# Patient Record
Sex: Male | Born: 1935 | Race: White | Hispanic: No | Marital: Married
Health system: Southern US, Community
[De-identification: ages and names within clinical notes are randomized; demographics above are authoritative.]

---

## 2005-01-16 ENCOUNTER — Ambulatory Visit: Payer: Self-pay | Admitting: Family Medicine

## 2005-03-09 ENCOUNTER — Emergency Department: Payer: Self-pay | Admitting: Emergency Medicine

## 2005-04-17 ENCOUNTER — Ambulatory Visit: Payer: Self-pay | Admitting: Cardiology

## 2006-10-24 ENCOUNTER — Ambulatory Visit: Payer: Self-pay | Admitting: Family Medicine

## 2008-05-12 ENCOUNTER — Ambulatory Visit: Payer: Self-pay | Admitting: General Surgery

## 2008-07-31 ENCOUNTER — Inpatient Hospital Stay: Payer: Self-pay | Admitting: Specialist

## 2008-08-09 ENCOUNTER — Ambulatory Visit (HOSPITAL_COMMUNITY): Admission: RE | Admit: 2008-08-09 | Discharge: 2008-08-09 | Payer: Self-pay | Admitting: Gastroenterology

## 2008-08-16 ENCOUNTER — Ambulatory Visit: Payer: Self-pay | Admitting: General Surgery

## 2008-08-17 ENCOUNTER — Inpatient Hospital Stay: Payer: Self-pay | Admitting: Surgery

## 2008-08-28 ENCOUNTER — Emergency Department: Payer: Self-pay | Admitting: Emergency Medicine

## 2008-08-30 ENCOUNTER — Ambulatory Visit: Payer: Self-pay | Admitting: General Surgery

## 2008-09-05 ENCOUNTER — Other Ambulatory Visit: Payer: Self-pay | Admitting: General Surgery

## 2010-05-13 LAB — DIFFERENTIAL
Eosinophils Absolute: 0.1 10*3/uL (ref 0.0–0.7)
Eosinophils Relative: 1 % (ref 0–5)
Lymphs Abs: 1.4 10*3/uL (ref 0.7–4.0)
Monocytes Absolute: 0.8 10*3/uL (ref 0.1–1.0)
Monocytes Relative: 9 % (ref 3–12)

## 2010-05-13 LAB — COMPREHENSIVE METABOLIC PANEL
ALT: 57 U/L — ABNORMAL HIGH (ref 0–53)
AST: 37 U/L (ref 0–37)
Albumin: 1.9 g/dL — ABNORMAL LOW (ref 3.5–5.2)
Alkaline Phosphatase: 553 U/L — ABNORMAL HIGH (ref 39–117)
BUN: 13 mg/dL (ref 6–23)
CO2: 30 mEq/L (ref 19–32)
Calcium: 8.4 mg/dL (ref 8.4–10.5)
Chloride: 99 mEq/L (ref 96–112)
Creatinine, Ser: 0.98 mg/dL (ref 0.4–1.5)
GFR calc Af Amer: >60 mL/min (ref >60)
GFR calc non Af Amer: >60 mL/min (ref >60)
Glucose, Bld: 120 mg/dL — ABNORMAL HIGH (ref 70–99)
Potassium: 3.1 mEq/L — ABNORMAL LOW (ref 3.5–5.1)
Sodium: 137 mEq/L (ref 135–145)
Total Bilirubin: 2.4 mg/dL — ABNORMAL HIGH (ref 0.3–1.2)
Total Protein: 5.2 g/dL — ABNORMAL LOW (ref 6.0–8.3)

## 2010-05-13 LAB — CBC
MCHC: 35 g/dL (ref 30.0–36.0)
RBC: 2.86 MIL/uL — ABNORMAL LOW (ref 4.22–5.81)
RDW: 14.6 % (ref 11.5–15.5)

## 2010-05-13 LAB — PROTIME-INR
INR: 1.2 (ref 0.00–1.49)
Prothrombin Time: 15.9 seconds — ABNORMAL HIGH (ref 11.6–15.2)

## 2010-05-13 LAB — TYPE AND SCREEN: Antibody Screen: NEGATIVE

## 2010-06-19 NOTE — Op Note (Signed)
NAMESINCERE, LIUZZI              ACCOUNT NO.:  [id number]   MEDICAL RECORD NO.:  [id number]          PATIENT TYPE:  AMB   LOCATION:  ENDO                         FACILITY:  [facility]   PHYSICIAN:  Petra Kuba, M.D.    DATE OF BIRTH:  [birth year]   DATE OF PROCEDURE:  08/09/2008  DATE OF DISCHARGE:                               OPERATIVE REPORT   PROCEDURE:  Endoscopic retrograde cholangiopancreatography.   SURGEON:  Petra Kuba, MD   INDICATIONS:  A patient with failed ERCP in [place], not able to find  the ampulla, requesting a second attempt.  Consent was signed after  risks, benefits, methods, and options were thoroughly discussed with  both the patient and his family.   MEDICINES USED THROUGHOUT THE PROCEDURE:  Fentanyl 100 mcg, Versed 16  mg, and glucagon 0.5 mg.   PROCEDURE IN DETAIL:  The side-viewing therapeutic video duodenoscope  was inserted by direct vision into the stomach and advanced through a  normal antrum and pylorus into the duodenum.  Multiple diverticula were  seen but the ampulla was seen to be found along the side of the an  ampulla.  Unfortunately, the scope had a problem with the channel and we  could not advance the sphincterotome through the channel and we had to  withdraw the scope and reinserted a second scope which was done in the  customary fashion without difficulty.  The ampulla was brought into  view.  Unfortunately, we could not advance the wire into the CBD.  We  did get a few pancreatic injections.  There was some spasm which  required glucagon.  We did switch to the smaller tapered sphincterotome  with the smaller wire but met the same problems.  We did go ahead and  rolled the patient onto his left side which again enabled [place] to get  adequate positioning on the papilla but again despite trying both  sphincterotomes as above and even rotating the sphincterotomes to try to  change the angle, we were unsuccessful at obtaining deep  selective  cannulation.  There were a few injections that we thought were heading  towards the CBD and later when we rolled him one more time back on his  belly, either a dilated CBD with one small stone or a gallstone in the  gallbladder was seen which confirmed that we had at least injected some  dye into the CBD.  Unfortunately, we did get a few submucosal  injections.  We did even try a straight catheter to see if that would be  of any assistance and again met the same fate.  The wires never were  passed deep into the pancreas but we did get a few PD injections.  We  did rolled him back on his back as mentioned above and did try with the  straight catheter and a new sphincterotome but at that point there was a  little too much edema and ampullary trauma and again despite a prolonged  effort, we were unable to get deep selective cannulation and we elected  to stop the procedure at this junction.  The scope was removed.  The  patient tolerated the procedure well.  There was no obvious immediate  complication.   ENDOSCOPIC DIAGNOSES:  1. Periampullary diverticula and a few other duodenal diverticula.  2. Few pancreatic duct injections, not overfilled.  3. Either common bile duct or gallbladder with small stone seen on      pictures at the end of the procedure when we rolled him back on his      belly.  4. Unable to get deep selective cannulation.  5. Tried two sphincterotomes and a straight catheter and him in two      different positions.  6. Submucosal injection.   PLAN:  Observe for delayed complications.  If none, the family will  think about surgical options versus a university trial to include  possible needle knife sphincterotomy and we would be happy to set that  up at either [facility] or [facility] based on family preference and we will  discuss with them after the procedure.           ______________________________  Petra Kuba, M.D.     MEM/MEDQ  D:  08/09/2008  T:   08/10/2008  Job:  [id number]   cc:   Alvira Philips, M.D.  Dr. Bluford Kaufmann

## 2010-09-26 ENCOUNTER — Emergency Department: Payer: Self-pay | Admitting: Emergency Medicine

## 2010-10-04 ENCOUNTER — Inpatient Hospital Stay: Payer: Self-pay | Admitting: Internal Medicine

## 2010-10-06 ENCOUNTER — Ambulatory Visit: Payer: Self-pay | Admitting: Internal Medicine

## 2010-10-22 ENCOUNTER — Inpatient Hospital Stay: Payer: Self-pay | Admitting: Internal Medicine

## 2010-11-05 ENCOUNTER — Ambulatory Visit: Payer: Self-pay | Admitting: Internal Medicine

## 2010-11-12 ENCOUNTER — Ambulatory Visit: Payer: Self-pay | Admitting: Urology

## 2010-11-23 ENCOUNTER — Inpatient Hospital Stay: Payer: Self-pay | Admitting: Internal Medicine

## 2010-11-30 ENCOUNTER — Ambulatory Visit: Payer: Self-pay | Admitting: Urology

## 2010-12-06 ENCOUNTER — Ambulatory Visit: Payer: Self-pay | Admitting: Internal Medicine

## 2010-12-12 ENCOUNTER — Ambulatory Visit: Payer: Self-pay | Admitting: Urology

## 2010-12-15 ENCOUNTER — Emergency Department: Payer: Self-pay | Admitting: Emergency Medicine

## 2010-12-23 ENCOUNTER — Emergency Department: Payer: Self-pay | Admitting: Unknown Physician Specialty

## 2011-01-24 ENCOUNTER — Emergency Department: Payer: Self-pay | Admitting: Internal Medicine

## 2011-02-03 ENCOUNTER — Emergency Department: Payer: Self-pay | Admitting: *Deleted

## 2011-02-05 ENCOUNTER — Ambulatory Visit: Payer: Self-pay | Admitting: Internal Medicine

## 2011-02-05 ENCOUNTER — Ambulatory Visit: Payer: Self-pay | Admitting: Oncology

## 2011-03-02 LAB — CBC WITH DIFFERENTIAL/PLATELET
Basophil %: 0.3 %
Eosinophil #: 0.3 10*3/uL (ref 0.0–0.7)
Eosinophil %: 2.6 %
Lymphocyte #: 1.8 10*3/uL (ref 1.0–3.6)
MCH: 29.1 pg (ref 26.0–34.0)
MCV: 91 fL (ref 80–100)
Monocyte #: 0.8 10*3/uL — ABNORMAL HIGH (ref 0.0–0.7)
Neutrophil #: 8.4 10*3/uL — ABNORMAL HIGH (ref 1.4–6.5)
Platelet: 216 10*3/uL (ref 150–440)
RDW: 15.8 % — ABNORMAL HIGH (ref 11.5–14.5)

## 2011-03-02 LAB — BASIC METABOLIC PANEL
Calcium, Total: 8.9 mg/dL (ref 8.5–10.1)
Chloride: 104 mmol/L (ref 98–107)
Co2: 28 mmol/L (ref 21–32)
EGFR (African American): >60
Sodium: 142 mmol/L (ref 136–145)

## 2011-03-03 ENCOUNTER — Inpatient Hospital Stay: Payer: Self-pay | Admitting: Internal Medicine

## 2011-03-03 LAB — CK TOTAL AND CKMB (NOT AT ARMC)
CK, Total: 61 U/L (ref 35–232)
CK-MB: 1.2 ng/mL (ref 0.5–3.6)
CK-MB: 1.2 ng/mL (ref 0.5–3.6)

## 2011-03-03 LAB — TROPONIN I: Troponin-I: <0.02 ng/mL

## 2011-03-04 LAB — BASIC METABOLIC PANEL
Anion Gap: 9 (ref 7–16)
Chloride: 109 mmol/L — ABNORMAL HIGH (ref 98–107)
Co2: 27 mmol/L (ref 21–32)
Creatinine: 1.06 mg/dL (ref 0.60–1.30)
Glucose: 103 mg/dL — ABNORMAL HIGH (ref 65–99)
Potassium: 4.1 mmol/L (ref 3.5–5.1)

## 2011-03-04 LAB — CBC WITH DIFFERENTIAL/PLATELET
Basophil #: 0 10*3/uL (ref 0.0–0.1)
Eosinophil #: 0.3 10*3/uL (ref 0.0–0.7)
HCT: 32.3 % — ABNORMAL LOW (ref 40.0–52.0)
Lymphocyte %: 28.6 %
MCHC: 32.6 g/dL (ref 32.0–36.0)
Monocyte %: 9 %
Neutrophil #: 3.9 10*3/uL (ref 1.4–6.5)
Neutrophil %: 57.1 %
Platelet: 155 10*3/uL (ref 150–440)
RBC: 3.62 10*6/uL — ABNORMAL LOW (ref 4.40–5.90)
RDW: 15.3 % — ABNORMAL HIGH (ref 11.5–14.5)

## 2011-03-04 LAB — LIPID PANEL
HDL Cholesterol: 34 mg/dL — ABNORMAL LOW (ref 40–60)
VLDL Cholesterol, Calc: 23 mg/dL (ref 5–40)

## 2011-03-06 LAB — PSA: PSA: 2.9 ng/mL (ref 0.0–4.0)

## 2011-03-06 LAB — PROT IMMUNOELECTROPHORES(ARMC)

## 2011-03-08 ENCOUNTER — Ambulatory Visit: Payer: Self-pay | Admitting: Internal Medicine

## 2011-03-08 ENCOUNTER — Ambulatory Visit: Payer: Self-pay | Admitting: Oncology

## 2011-04-05 DEATH — deceased

## 2013-07-04 IMAGING — CR DG CHEST 2V
1 series · 2 of 2 positions shown · non-contrast
Comparison: none

REASON FOR EXAM: Shortness of Breath
COMMENTS:

PROCEDURE:     DXR - DXR CHEST PA (OR AP) AND LATERAL  - October 04, 2010  [DATE]
RESULT:     The lung fields are clear. No pneumonia, pneumothorax or pleural
effusion is seen. Heart size is normal. Mediastinal and osseous structures
show no significant abnormalities.

[Series 1: view not recorded · 0.17mm/px · 2 of 2 slices shown]
[im 1/2]
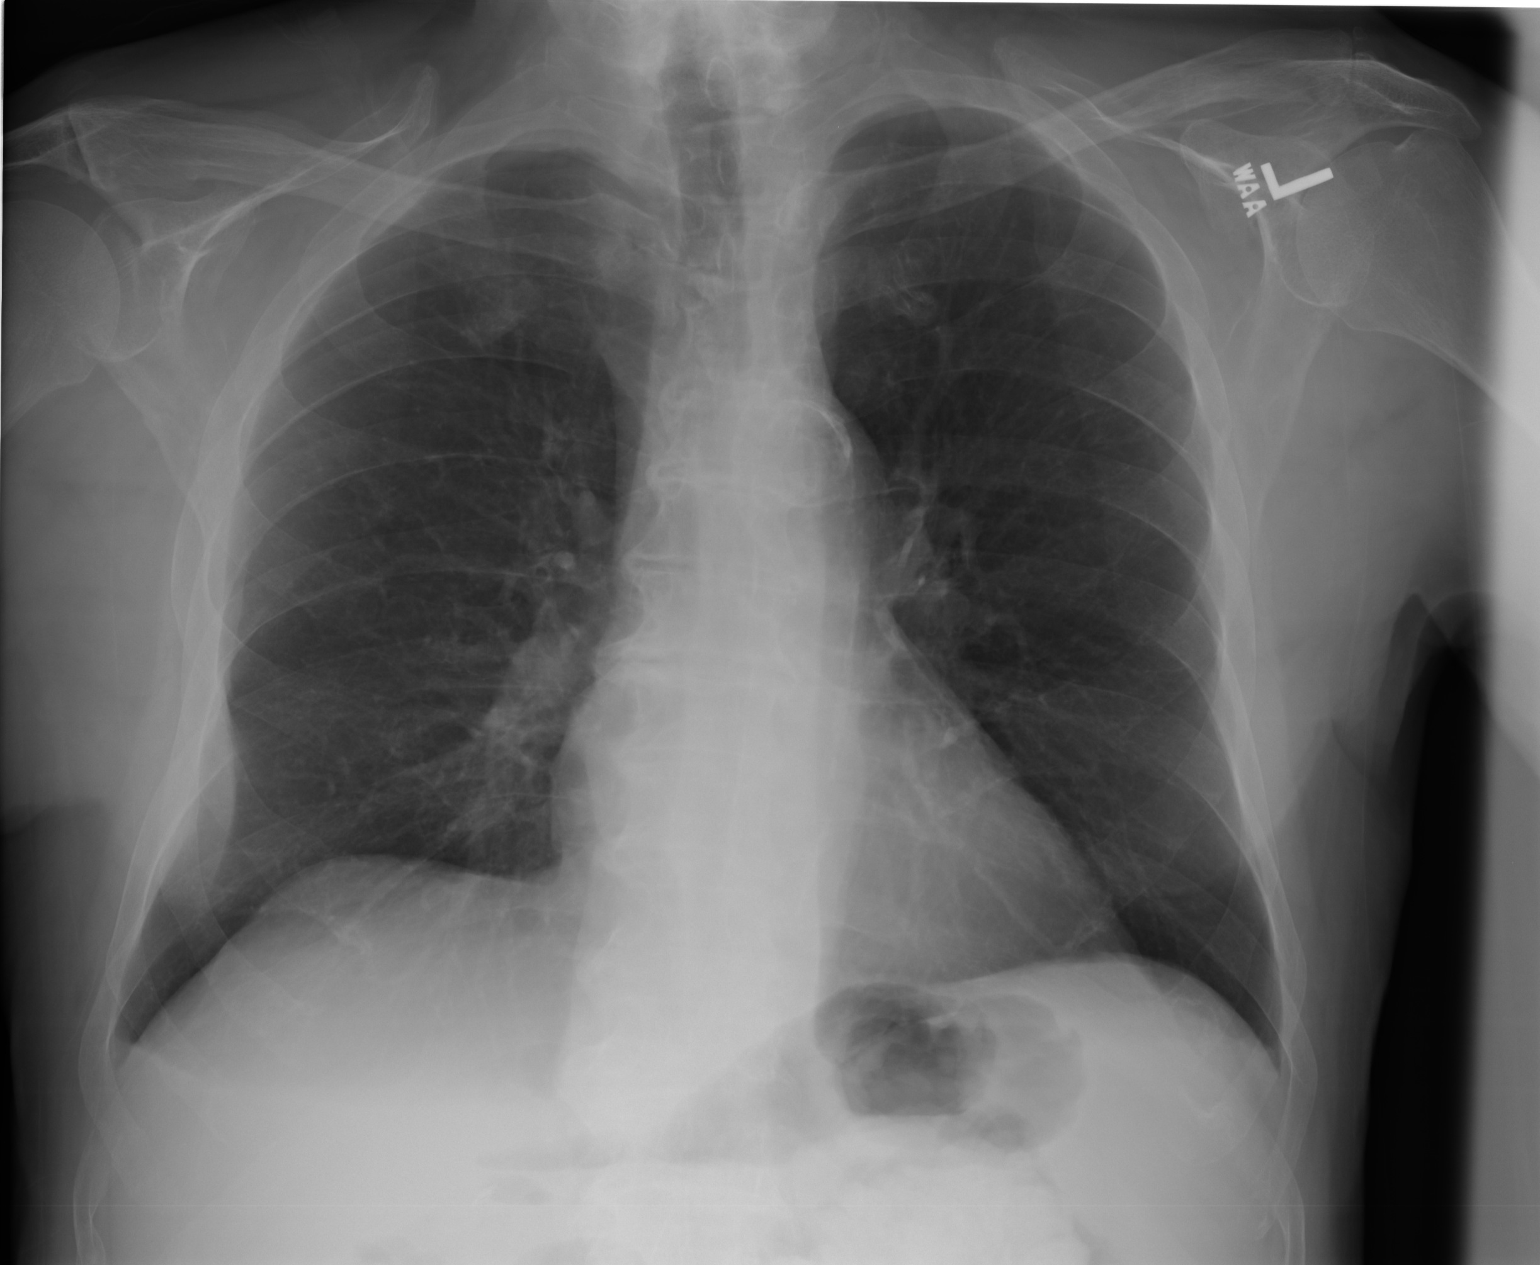
[im 2/2]
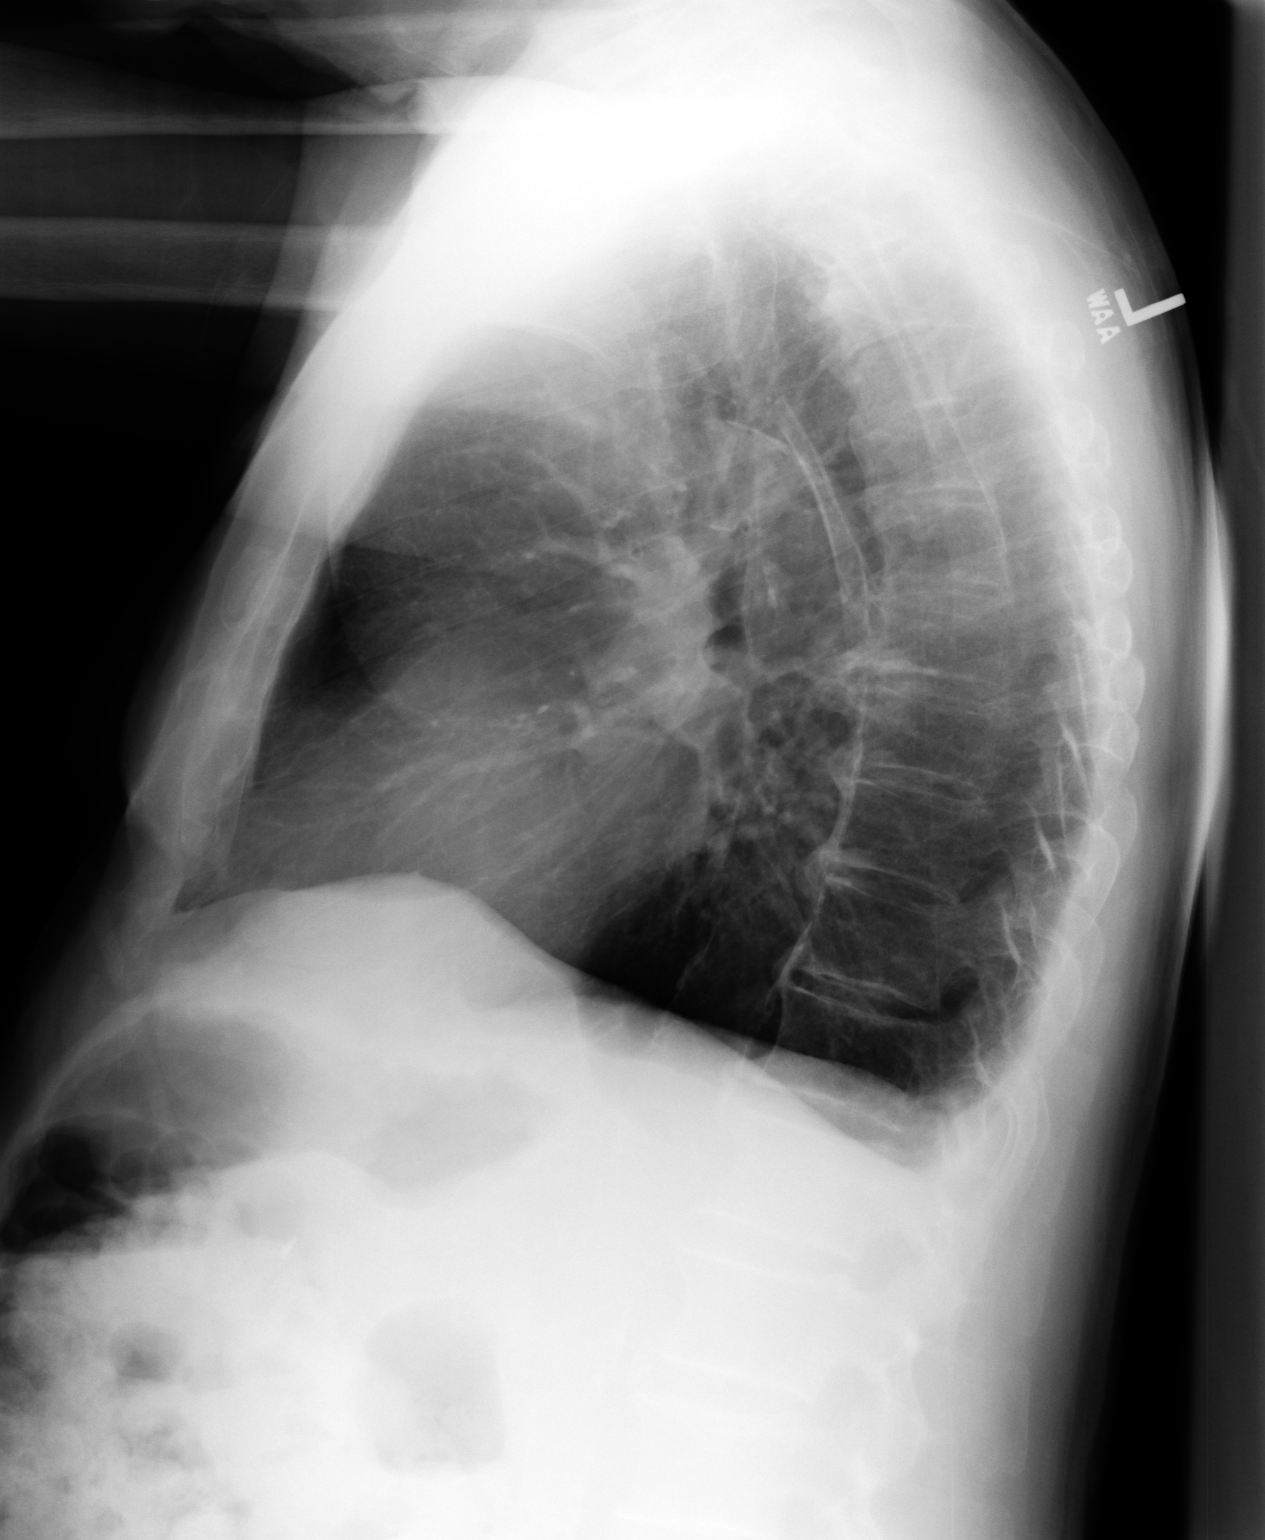

[2 of 2 positions shown; findings below may reference images not displayed]

IMPRESSION: No significant abnormalities are noted.

## 2013-07-12 IMAGING — CT CT ABD-PELV W/O CM
1 of 2 series · 14 of 32 positions shown, 18 images · non-contrast
Comparison: none

REASON FOR EXAM: (1) abdominal distention and pain; (2) pain;    NOTE:
Nursing to Give Oral CT Co
COMMENTS:

PROCEDURE:     CT  - CT ABDOMEN AND PELVIS W[DATE]  [DATE]
RESULT:     Comparison: 10/04/2010
TECHNIQUE: Multiple axial images from the lung bases to the symphysis pubis
were obtained with oral and without intravenous contrast.

[Series 2: 3mm soft tissue · axial · 0.76mm/px · z∈[-1109,-701]mm · 14 of 150 slices shown, 18 images]
[im 7/150  soft-tissue]
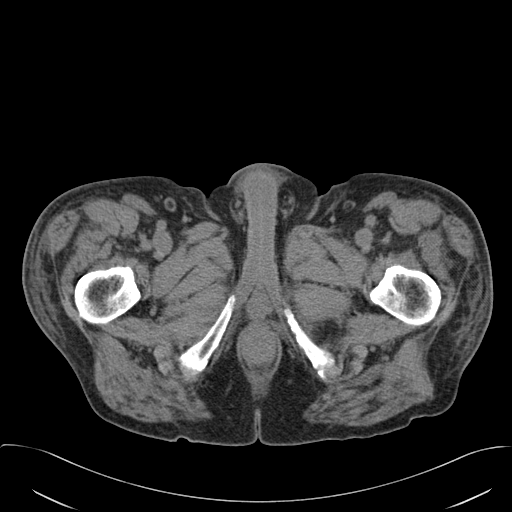
[im 7/150  bone]
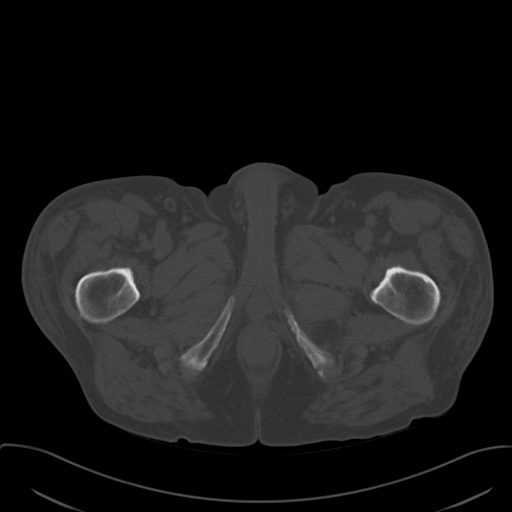
[im 19/150  soft-tissue]
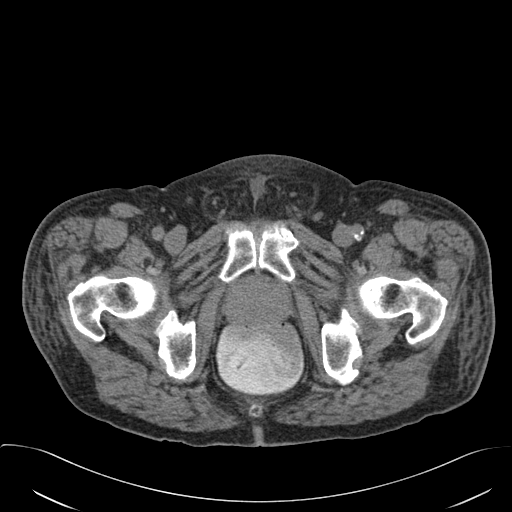
[im 32/150  soft-tissue]
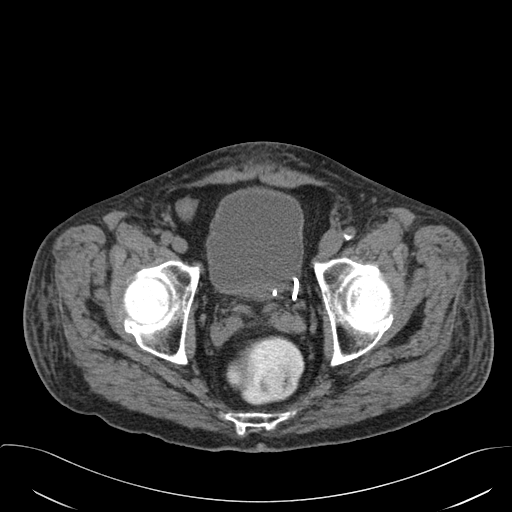
[im 44/150  soft-tissue]
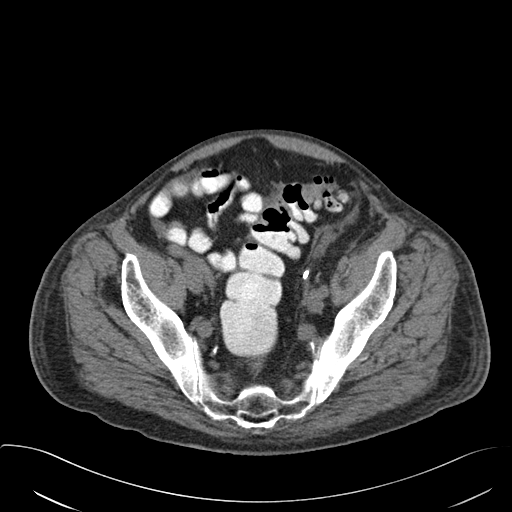
[im 56/150  soft-tissue]
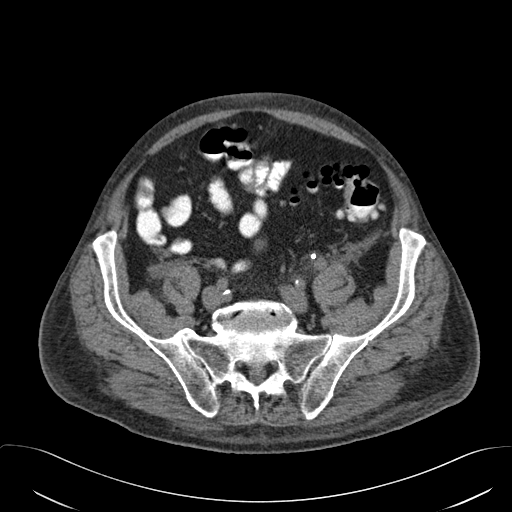
[im 69/150  soft-tissue]
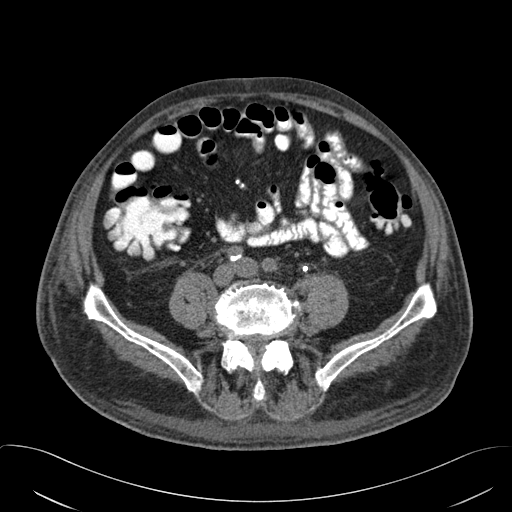
[im 81/150  soft-tissue]
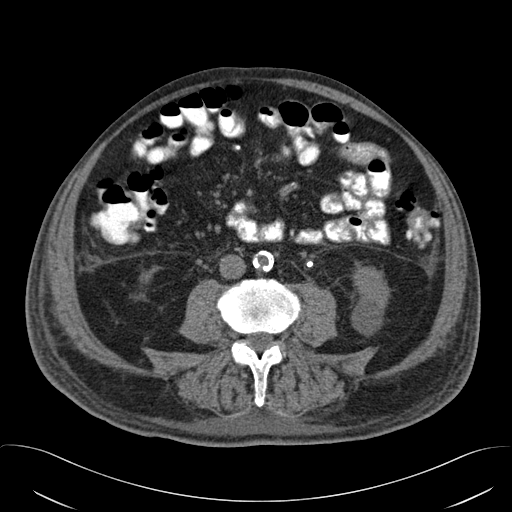
[im 94/150  soft-tissue]
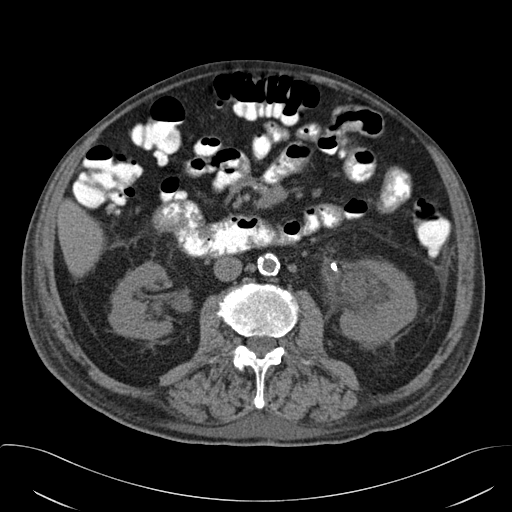
[im 106/150  soft-tissue]
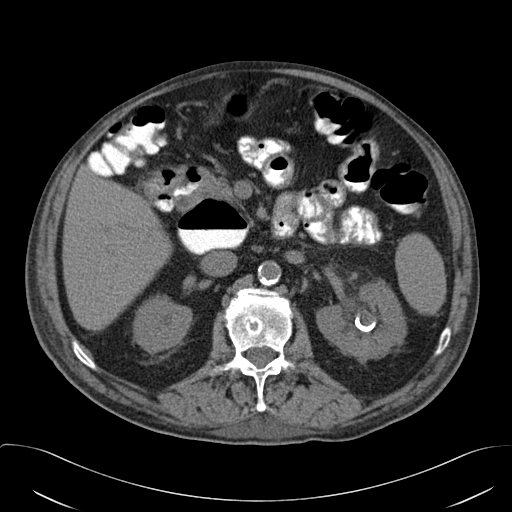
[im 106/150  bone]
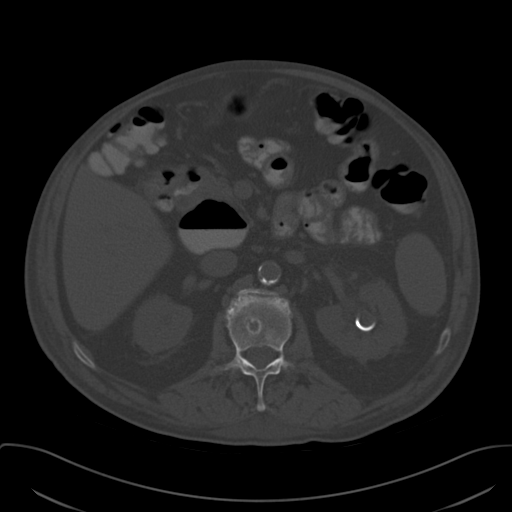
[im 118/150  soft-tissue]
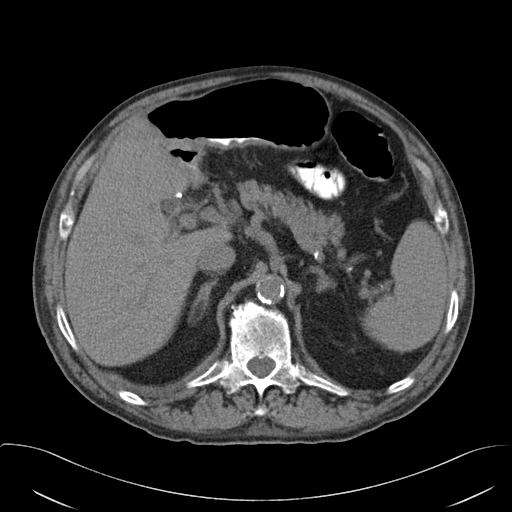
[im 125/150  lung]
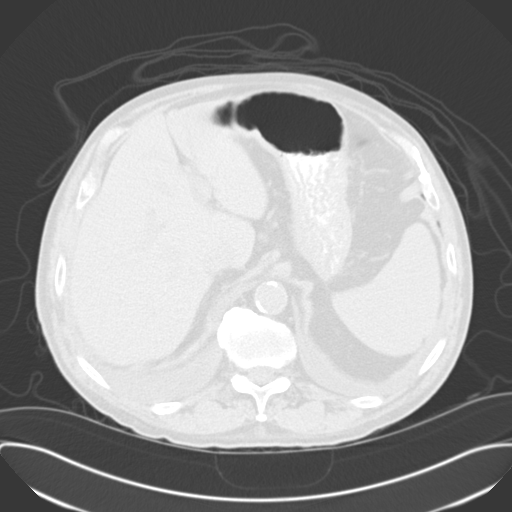
[im 131/150  soft-tissue]
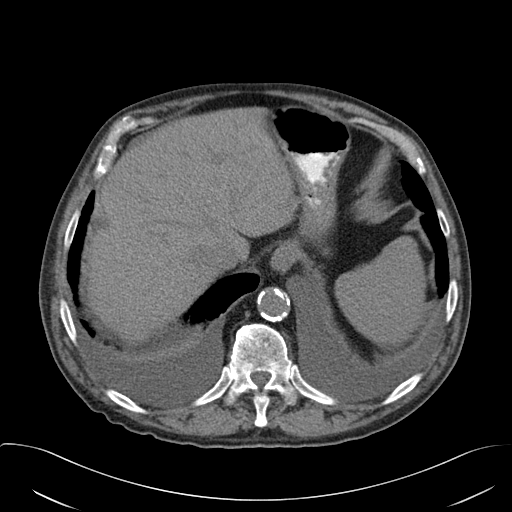
[im 131/150  lung]
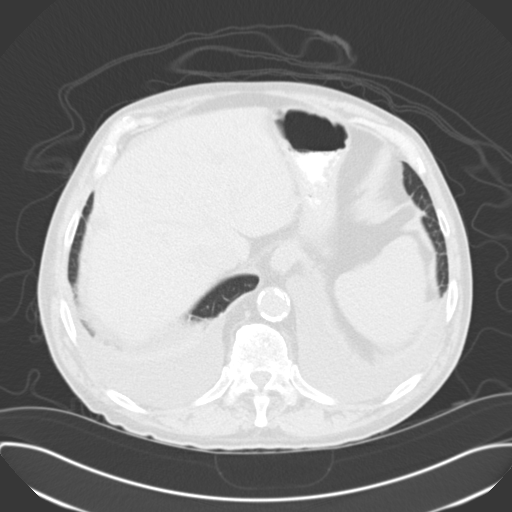
[im 137/150  lung]
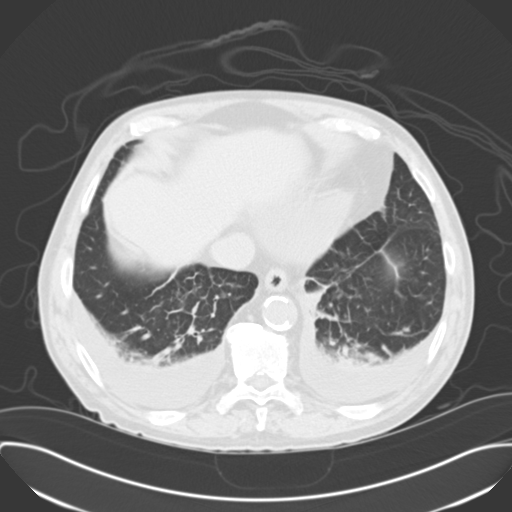
[im 143/150  soft-tissue]
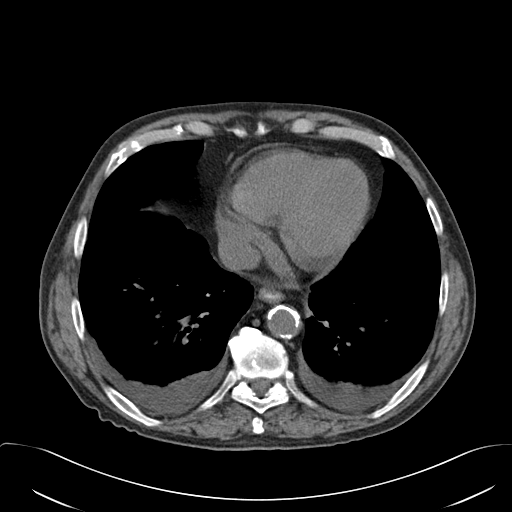
[im 143/150  lung]
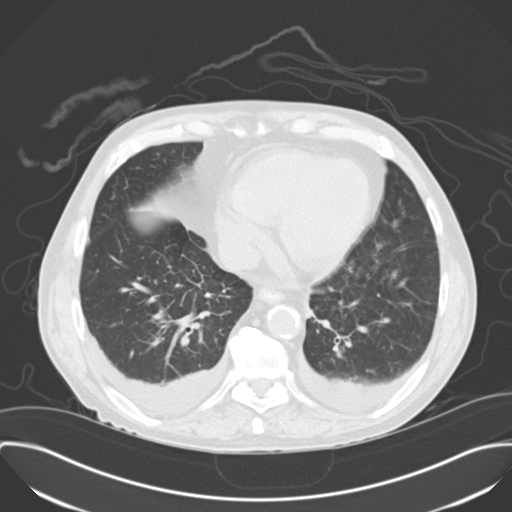

[14 of 32 positions shown; findings below may reference images not displayed]

FINDINGS: There are small bilateral pleural effusions. Dependent basilar opacities
likely represent atelectasis. There are multiple small groundglass nodules
in the lower lobes, right greater than left. These are new from the recent
prior CT.

Lack of intravenous contrast limits evaluation of the solid abdominal
organs.  Minimal low-attenuation along the falciform ligament likely
secondary to focal fatty infiltration. The patient is status post
cholecystectomy. The spleen, adrenals, and pancreas are unremarkable. There
is a large duodenal diverticulum as seen on prior studies.

There has been interval placement of a double J ureteral stent in the left
kidney which extends to the bladder. The distal catheter is coiled at or
just within the left ureterovesical junction. The calculus at the left UVJ
is no longer seen. There is mild left pelviectasis, which is decreased from
prior. Mild stranding of the left pelvis is also decreased. The calculus in
the proximal ureter is no longer visualized. Low-attenuation lesion in the
left kidney is consistent with a cyst. No hydronephrosis seen in the right
kidney.

The small and large bowel are normal in caliber. There is diverticulosis of
the sigmoid colon and descending colon. The appendix is normal. Mild
stranding and fluid extending along the left retroperitoneum is unchanged
from prior. The prostate is enlarged.

No aggressive lytic or sclerotic osseous lesions identified.
IMPRESSION: 1. Interval placement of left double-J ureteral stent. There is decreased
dilatation and stranding of the left renal collecting system. The distal
ureteral stent is coiled at or just within the left ureterovesical junction.
Consider slight repositioning.
2. Small bilateral pleural effusions. New mild basilar ground glass
opacities are nonspecific, but may represent aspiration or infection.
3. No evidence of bowel obstruction.

## 2013-10-24 IMAGING — US US EXTREM LOW VENOUS*L*
1 series · 17 of 24 positions shown · non-contrast
Comparison: none

REASON FOR EXAM: pain, swelling, trauma x 3 weeks ago
COMMENTS:   LMP: (Male)

[Series 1: us extrem low venous*left* · 17 of 30 slices shown]
[im 1/30]
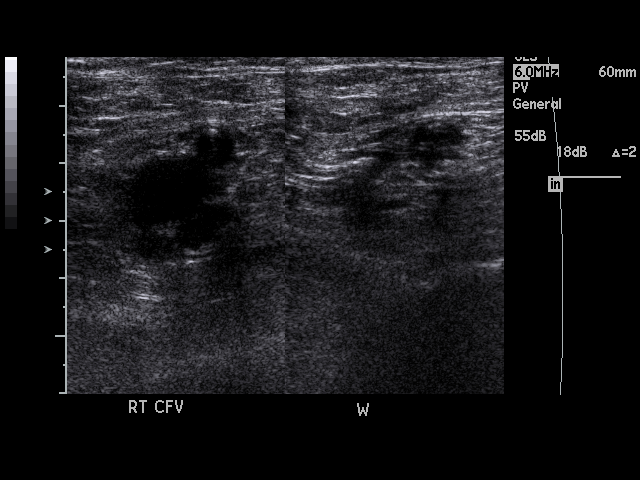
[im 3/30]
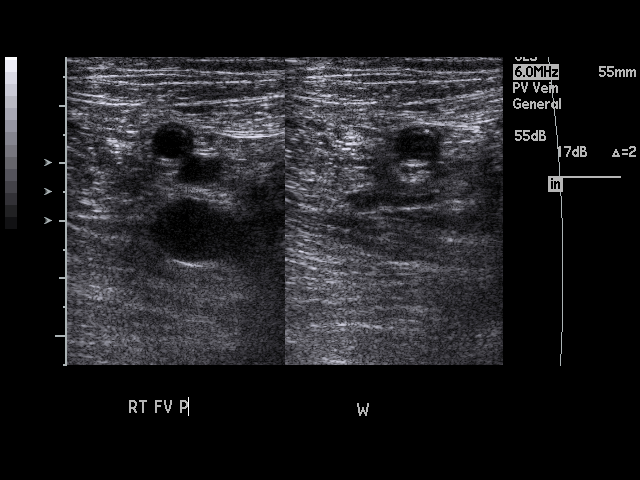
[im 4/30]
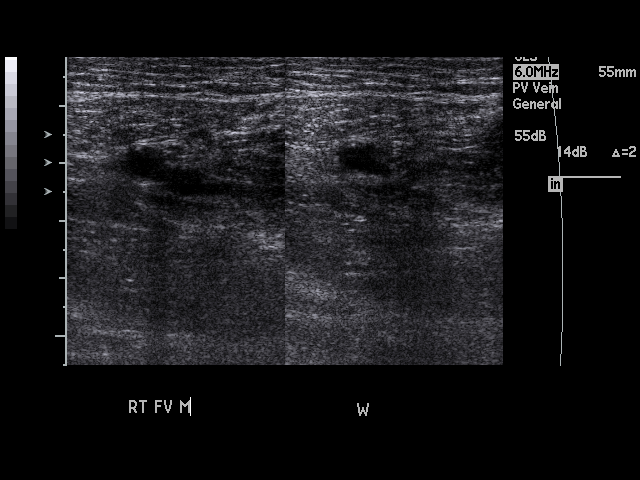
[im 6/30]
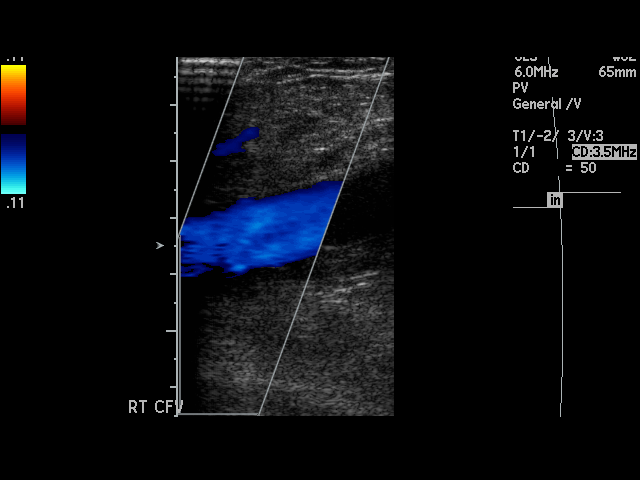
[im 8/30]
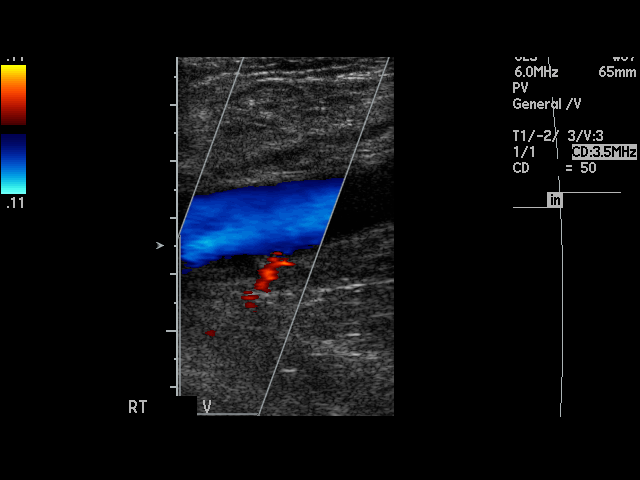
[im 9/30]
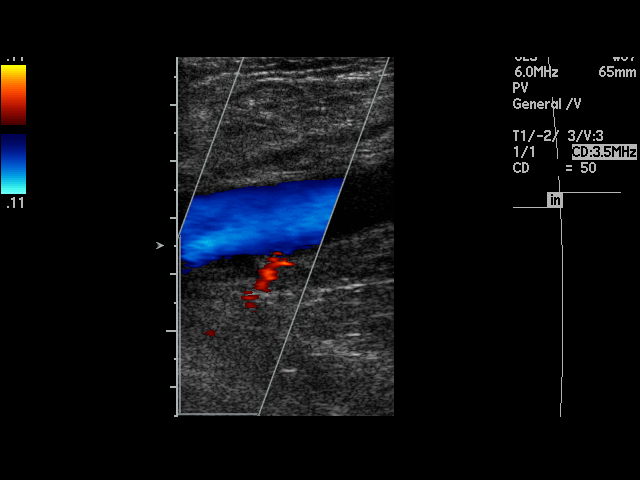
[im 12/30]
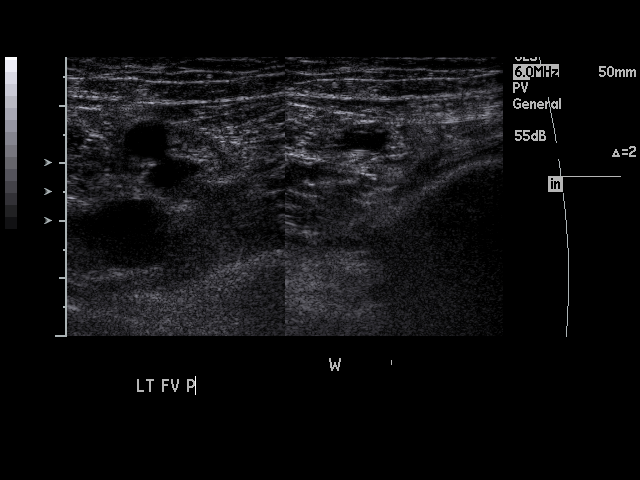
[im 13/30]
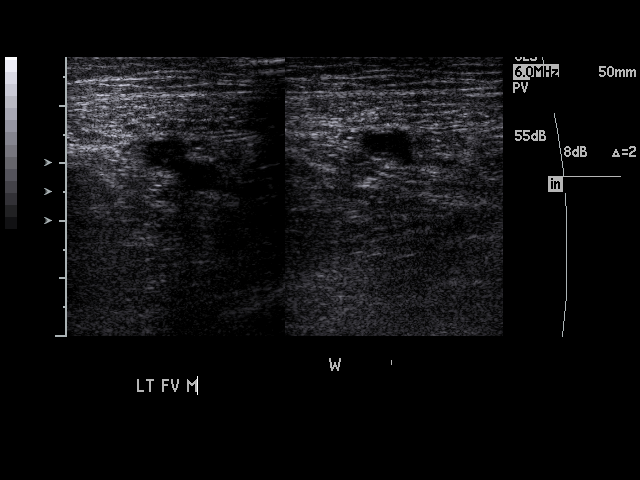
[im 16/30]
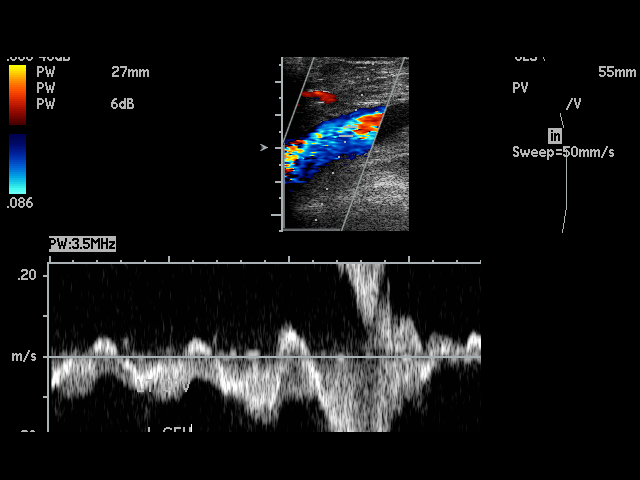
[im 17/30]
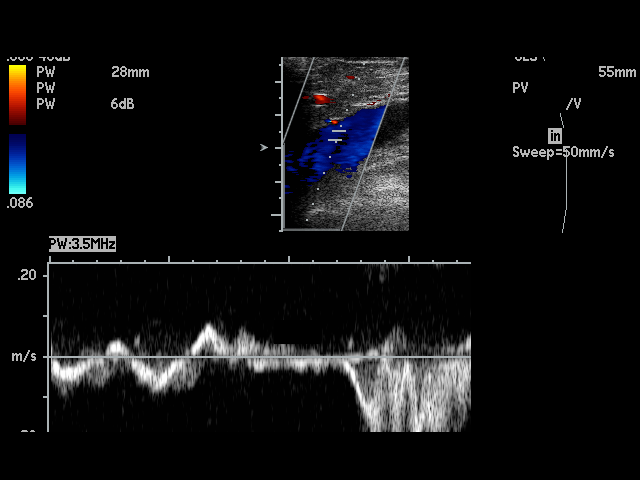
[im 18/30]
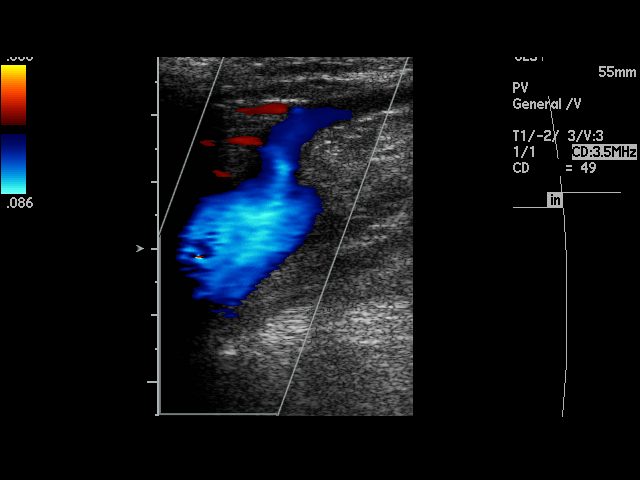
[im 21/30]
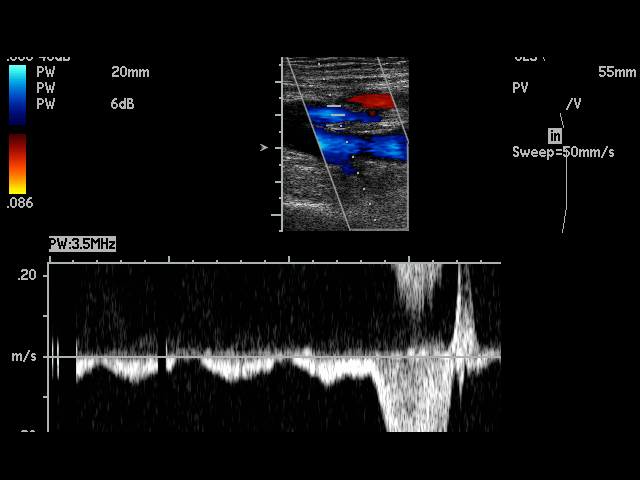
[im 22/30]
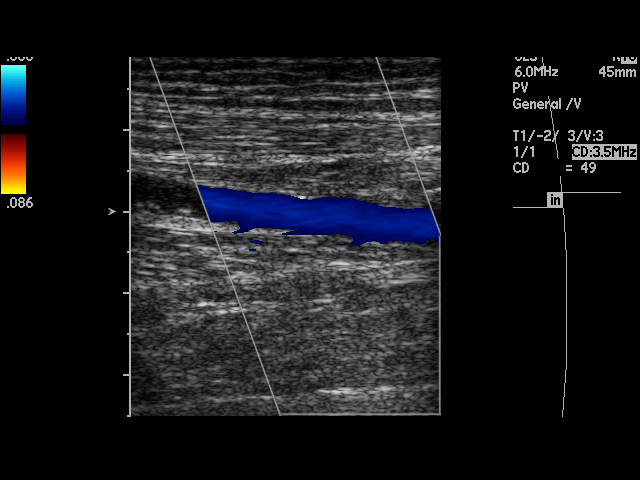
[im 24/30]
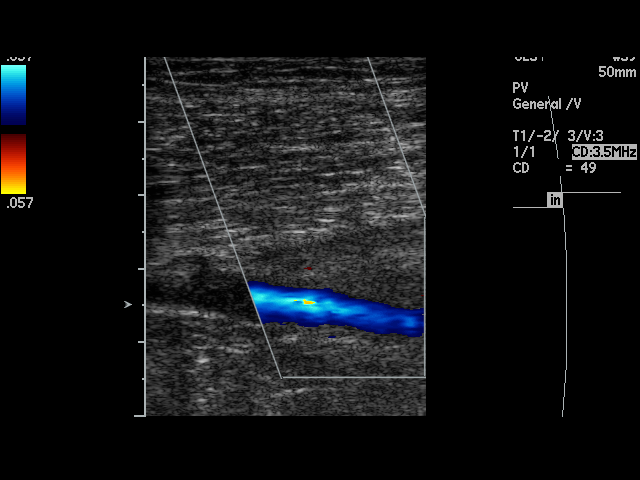
[im 26/30]
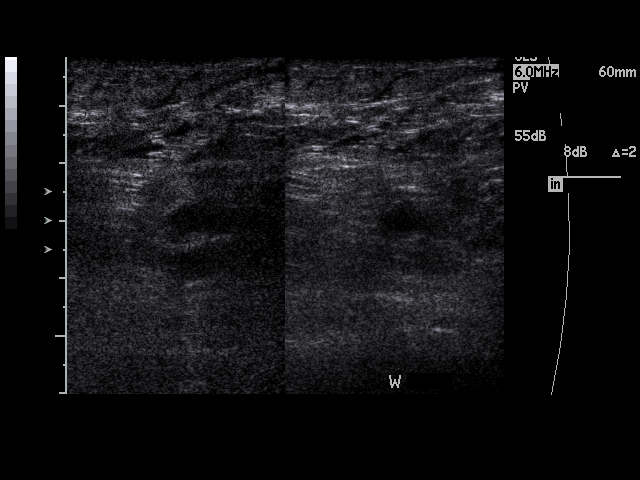
[im 27/30]
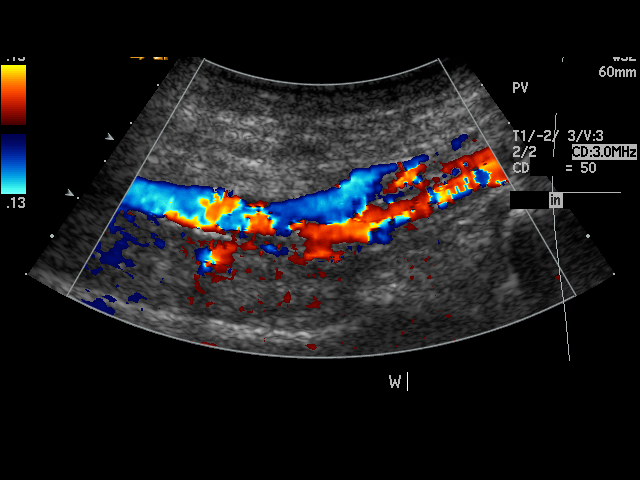
[im 30/30]
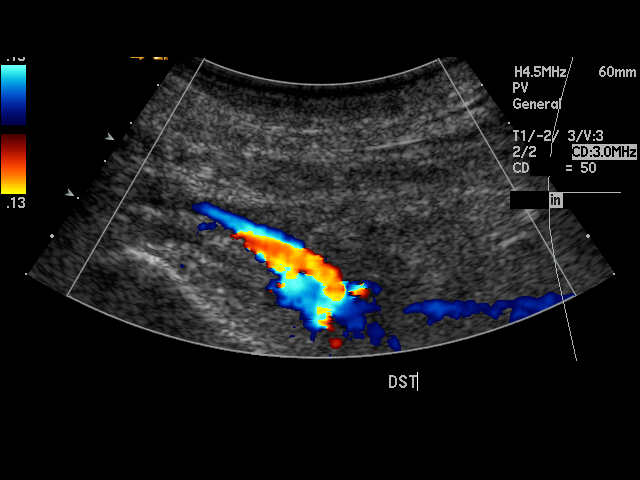

[17 of 24 positions shown; findings below may reference images not displayed]

PROCEDURE:     US  - US DOPPLER LOW EXTR LEFT  - January 24, 2011  [DATE]

RESULT:     Comparison: 12/15/2010

Technique and findings: Multiple longitudinal and transverse grayscale as
well as color and spectral Doppler images of the left lower extremity veins
were obtained from the common femoral veins through the popliteal veins.

Please note, the initial images are labeled right common femoral and femoral
vein. However, these images were obtained of the left leg. This was
confirmed with the ultrasound technologist. The left common femoral,
femoral, and popliteal veins are patent, demonstrating normal color-flow and
compressibility. No intraluminal thrombus is identified.  There is normal
respiratory variation and augmentation demonstrated at all vein levels.
IMPRESSION: No evidence of DVT in the left lower extremity.

## 2014-05-29 NOTE — H&P (Signed)
PATIENT NAME:  Dave Sanchez, Broly W MR#:  [id number] DATE OF BIRTH:  [birth year]  DATE OF ADMISSION:  03/03/2011  PRIMARY CARE PHYSICIAN: Richard L. Sullivan LoneGilbert, MD  CHIEF COMPLAINT: Fall.   HISTORY OF PRESENT ILLNESS: The patient is an 81 year old male with a history of sleep apnea, chronic obstructive pulmonary disease, diabetes, dementia, presents with chief complaint of fall. He complained of right-sided chest pain. He apparently lost his balance and fell from a standing position. He rubbed up against a shoe on the floor. He developed chest pain described as severe. He recently had a urinary tract infection one week ago. A chest x-ray in the Emergency Room showed multiple rib fractures. He underwent right CT of the cervical spine which showed multiple lytic lesions.   PAST MEDICAL HISTORY:  1. Obstructive uropathy.  2. Bile duct stone.  3. Sleep apnea.  4. Coronary artery disease.  5. Stent placement.  6. Diabetes type 2.  7. Hypercholesterolemia.  8. Hypertension.  9. Cholecystectomy. 10. Dementia.  11. Benign prostatic hypertrophy.  12. Depression.  ALLERGIES: No known drug allergies.   CURRENT MEDICATIONS:  1. Amlodipine 5 mg p.o. daily.  2. Aspirin 325 mg p.o. daily.  3. Flomax 0.4 mg p.o. in the a.m.  4. Haldol 0.4 mg p.r.n.  5. Keflex 500 mg p.o. b.i.d.  6. Lasix 20 mg p.o. daily.  7. Propranolol 40 mg p.o. t.i.d.  8. Risperdal 0.5 mg p.o. b.i.d.  9. Simvastatin 20 mg p.o. at bedtime.  10. Tylenol 500 mg p.o. every 6 hours.   SOCIAL HISTORY: The patient is a resident of a skilled nursing facility. No history of tobacco abuse, alcohol abuse, or drug abuse.   FAMILY HISTORY: Unable to obtain. The patient is not able to speak. He has history of severe dementia.   REVIEW OF SYSTEMS: HEENT: No visual problems, dysphagia, sore throat. CARDIOVASCULAR: No orthopnea, PND, syncope. RESPIRATORY: No cough, wheezing, or hemoptysis. GASTROINTESTINAL: No nausea, vomiting, abdominal pain,  hematemesis, hematochezia, or melena. GU: No hematuria, dysuria or frequency. NEUROLOGIC: No reports of headache, focal weakness, seizures. SKIN: No warts or lesions or  rash.   PHYSICAL EXAMINATION:  VITAL SIGNS: Temperature is 98.2, heart rate is 70, respiratory rate is 20, blood pressure 159/67, O2 saturations 100%.   HEENT: Head is atraumatic, normocephalic. Pupils are equal, round and reactive to light and accommodation. Extraocular movements are intact. Sclerae are anicteric. Mucous membranes are moist.   NECK: Supple. No organomegaly.   CARDIOVASCULAR: S1, S2, regular rate and rhythm. No gallops. No thrills. No murmurs.   LUNGS: Clear to auscultation. No rales, no rhonchi, no wheezes, no bronchial breath sounds.   ABDOMEN: Abdomen is soft, nontender nondistended. Normal bowel sounds. No hepatosplenomegaly.   GENITOURINARY: There is no hematuria or masses.  SKIN: No lesions, no rash.  ENDOCRINE: No masses, no thyromegaly noted.   LYMPH: No lymphadenopathy or nodes palpable.   NEUROLOGICAL: Cranial nerves II through XII are grossly intact. Motor strength is 5/5 bilateral upper and lower extremities. Sensation is within normal limits. No focal neurological deficits noted on examination.   MUSCULOSKELETAL: There is no arthritis, no joint effusion or swelling.   HEMATOLOGICAL: There is no ecchymosis, no bleeding, and no petechiae noted.   EXTREMITIES: No cyanosis, no clubbing, no edema. 2+ pedal pulses noted bilaterally.   LABORATORY, DIAGNOSTIC AND RADIOLOGICAL DATA:  The patient's WBC count is 1300, hemoglobin 12.3, hematocrit 35.5, platelet count is 216. MCV is 91.  Glucose is 189, BUN is 24, creatinine  is 1.38, sodium is 142, potassium is 4.2, chloride is 104, CO2 is 28, calcium is 8.9. Estimated GFR is 52.0.   ASSESSMENT AND PLAN: The patient is an 81 year old male who presented to the Emergency Department after a fall and has multiple rib fractures.   1. Fall and multiple  rib fractures: Admit the patient to a Medical unit. Start morphine for pain control. We will check serial cardiac enzymes and troponin.  2. Multiple lytic lesions noted on  CT of the  C-spine.  3. Chronic renal insufficiency: Baseline creatinine is 1.4. Creatinine today is 1.28. Monitor renal functions closely.  4. Benign prostatic hypertrophy: Continue Flomax.  5. Hypertension: Continue amlodipine, propranolol.  6. Dementia: Continue Risperdal.  7. Hyperlipidemia: Continue simvastatin.   ____________________________ Donia Ast, MD jsp:cbb D: 03/03/2011 01:38:42 ET T: 03/03/2011 11:43:10 ET JOB#: [id number] cc: Gerlene Burdock L. Sullivan Lone, MD Donia Ast MD ELECTRONICALLY SIGNED 03/03/2011 21:45

## 2014-05-29 NOTE — Discharge Summary (Signed)
PATIENT NAME:  Dave Sanchez, Dave Sanchez MR#:  [id number] DATE OF BIRTH:  01-10-1925  DATE OF ADMISSION:  03/03/2011 DATE OF DISCHARGE:  03/06/2011  DISPOSITION: To hospice home.  DISCHARGE DIAGNOSES:  1. Stage IV cancer with unknown primary.  2. Coronary artery disease.  3. History of dementia.  4. Hypertension.  5. Hyperlipidemia. 6. Diabetes. 7. History of falls and repeated hospitalizations. 8. Multiple lytic lesions throughout the spine, likely stage IV cancer and transferred to hospice home due to the above-mentioned reasons. Poor prognosis.    CONSULTATIONS  1. Oncology consult with Dr. Orlie DakinFinnegan.  2. Palliative care consult with Dr. Harvie JuniorPhifer.  3. Hospice consult.   CODE STATUS: DO NOT RESUSCITATE.   HOSPITAL COURSE: 81 year old male with history of sleep apnea, hypertension, diabetes, coronary artery disease, hyperlipidemia came in after a fall. He was brought in from the nursing home. He had right-sided chest pain. Look in the history and physical for full details. He is admitted for fall with multiple rib fractures and started on pain medications and also his oxygen saturations were followed. He did not have any hypoxia and chest x-ray showed multiple old and new right rib fractures. Patient did not complain of pain or trouble breathing. He just had cough since yesterday. Patient had a CT of the spine because of the fall. Cervical spine CT showed multiple lytic lesions throughout the cervical spine and because of that Dr. Orlie DakinFinnegan was consulted. Patient had a PSA which is normal. Serum protein electrophoresis is normal but because cervical spine lesions are suspicious for underlying malignancy due to underlying severe dementia and declining performance aggressive work-up is not recommended. Patient's daughter and the wife are informed about the prognosis and requested palliative care to see the patient and Dr. Harvie JuniorPhifer kindly saw the patient and had a family meeting and goals of therapy are  discussed and because of multiple hospitalizations with rapid decline in his status with frequent falls and stage IV cancer his prognosis is poor and the family agreed for transfer to hospice home and we are transferring him to hospice home. He will be continued on his regular diet and also he will take his other medications. For the cough that he is having I started him on Augmentin and he is to resume the medications as appropriate and use Roxanol for comfort. Patient's CODE STATUS is DO NOT RESUSCITATE. Rest of the labs are within normal limits especially the CBC and BMP, hemoglobin 10.5, hematocrit 32.3, and platelets 155. Creatinine 1.06 and BUN 18, PSA  2.9. Discussed the plan with the patient and patient's wife and the daughter and they are in agreement.   TIME SPENT ON DISCHARGE PREPARATION: More than 30 minutes.  ____________________________ Katha HammingSnehalatha Myli Pae, MD sk:cms D: 03/06/2011 13:28:54 ET T: 03/06/2011 14:35:03 ET JOB#: [id number]  cc: Katha HammingSnehalatha Devonia Farro, MD, <Dictator> Katha HammingSNEHALATHA Deion Forgue MD ELECTRONICALLY SIGNED 03/25/2011 16:18

## 2014-05-29 NOTE — Consult Note (Signed)
Ptient lying in bed, pleasently confused.  Wife present.  Lungs clear, HRR, abd soft, positive bowel sounds.  Patient denies bone pain, dyspnea.  Wife reports patient falls often at facility.  Educated wife on findings of CT scan, lytic lesions, poss MM or other etiology.  Will obtain UIEP, SIEP first.  Patient admitted with ARF, will hold off on scans for now.  Dr. Grayland Ormond will follow.  Wife agreeable to this plan.    Electronic Signatures: Waynard Edwards (NP)  (Signed on 27-Jan-13 16:03)  Authored  Last Updated: 27-Jan-13 16:03 by Waynard Edwards (NP)
